# Patient Record
Sex: Male | Born: 1990
Health system: Southern US, Community
[De-identification: ages and names within clinical notes are randomized; demographics above are authoritative.]

## PROBLEM LIST (undated history)

## (undated) DIAGNOSIS — T7840XA Allergy, unspecified, initial encounter: Secondary | ICD-10-CM

## (undated) DIAGNOSIS — G43909 Migraine, unspecified, not intractable, without status migrainosus: Secondary | ICD-10-CM

## (undated) HISTORY — DX: Migraine, unspecified, not intractable, without status migrainosus: G43.909

## (undated) HISTORY — PX: NO PAST SURGERIES: SHX2092

## (undated) HISTORY — DX: Allergy, unspecified, initial encounter: T78.40XA

---
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
## 2013-06-19 ENCOUNTER — Encounter (HOSPITAL_COMMUNITY): Payer: Self-pay | Admitting: Emergency Medicine

## 2013-06-19 ENCOUNTER — Emergency Department (HOSPITAL_COMMUNITY): Payer: BC Managed Care – PPO

## 2013-06-19 ENCOUNTER — Emergency Department (HOSPITAL_COMMUNITY)
Admission: EM | Admit: 2013-06-19 | Discharge: 2013-06-19 | Disposition: A | Discharge status: Home / Self Care | Payer: BC Managed Care – PPO | Attending: Emergency Medicine | Admitting: Emergency Medicine

## 2013-06-19 DIAGNOSIS — R000 Tachycardia, unspecified: Secondary | ICD-10-CM | POA: Insufficient documentation

## 2013-06-19 DIAGNOSIS — R059 Cough, unspecified: Secondary | ICD-10-CM | POA: Insufficient documentation

## 2013-06-19 DIAGNOSIS — J111 Influenza due to unidentified influenza virus with other respiratory manifestations: Secondary | ICD-10-CM

## 2013-06-19 DIAGNOSIS — B9789 Other viral agents as the cause of diseases classified elsewhere: Secondary | ICD-10-CM | POA: Insufficient documentation

## 2013-06-19 DIAGNOSIS — R509 Fever, unspecified: Secondary | ICD-10-CM | POA: Insufficient documentation

## 2013-06-19 DIAGNOSIS — R05 Cough: Secondary | ICD-10-CM | POA: Insufficient documentation

## 2013-06-19 DIAGNOSIS — Z79899 Other long term (current) drug therapy: Secondary | ICD-10-CM | POA: Insufficient documentation

## 2013-06-19 LAB — URINALYSIS, ROUTINE W REFLEX MICROSCOPIC
Bilirubin Urine: NEGATIVE
Glucose, UA: NEGATIVE mg/dL
Ketones, ur: NEGATIVE mg/dL
LEUKOCYTES UA: NEGATIVE
NITRITE: NEGATIVE
Protein, ur: NEGATIVE mg/dL
SPECIFIC GRAVITY, URINE: 1.009 (ref 1.005–1.030)
UROBILINOGEN UA: 0.2 mg/dL (ref 0.0–1.0)
pH: 5.5 (ref 5.0–8.0)

## 2013-06-19 LAB — COMPREHENSIVE METABOLIC PANEL
ALT: 16 U/L (ref 0–53)
AST: 25 U/L (ref 0–37)
Albumin: 4.3 g/dL (ref 3.5–5.2)
Alkaline Phosphatase: 52 U/L (ref 39–117)
BUN: 7 mg/dL (ref 6–23)
CALCIUM: 8.7 mg/dL (ref 8.4–10.5)
CO2: 19 mEq/L (ref 19–32)
Chloride: 99 mEq/L (ref 96–112)
Creatinine, Ser: 1.03 mg/dL (ref 0.50–1.35)
GFR calc Af Amer: >90 mL/min (ref >90)
GFR calc non Af Amer: >90 mL/min (ref >90)
Glucose, Bld: 110 mg/dL — ABNORMAL HIGH (ref 70–99)
Potassium: 4.1 mEq/L (ref 3.7–5.3)
SODIUM: 135 meq/L — AB (ref 137–147)
TOTAL PROTEIN: 7.3 g/dL (ref 6.0–8.3)
Total Bilirubin: 0.4 mg/dL (ref 0.3–1.2)

## 2013-06-19 LAB — MONONUCLEOSIS SCREEN: MONO SCREEN: NEGATIVE

## 2013-06-19 LAB — CBC WITH DIFFERENTIAL/PLATELET
Basophils Absolute: 0 10*3/uL (ref 0.0–0.1)
Basophils Relative: 0 % (ref 0–1)
EOS ABS: 0 10*3/uL (ref 0.0–0.7)
Eosinophils Relative: 0 % (ref 0–5)
HEMATOCRIT: 42.7 % (ref 39.0–52.0)
HEMOGLOBIN: 15.6 g/dL (ref 13.0–17.0)
LYMPHS ABS: 0.9 10*3/uL (ref 0.7–4.0)
Lymphocytes Relative: 10 % — ABNORMAL LOW (ref 12–46)
MCH: 32 pg (ref 26.0–34.0)
MCHC: 36.5 g/dL — AB (ref 30.0–36.0)
MCV: 87.7 fL (ref 78.0–100.0)
Monocytes Absolute: 1.6 10*3/uL — ABNORMAL HIGH (ref 0.1–1.0)
Monocytes Relative: 18 % — ABNORMAL HIGH (ref 3–12)
Neutro Abs: 6.3 10*3/uL (ref 1.7–7.7)
Neutrophils Relative %: 72 % (ref 43–77)
PLATELETS: 127 10*3/uL — AB (ref 150–400)
RBC: 4.87 MIL/uL (ref 4.22–5.81)
RDW: 11.9 % (ref 11.5–15.5)
WBC: 8.7 10*3/uL (ref 4.0–10.5)

## 2013-06-19 LAB — LACTIC ACID, PLASMA: LACTIC ACID, VENOUS: 1.1 mmol/L (ref 0.5–2.2)

## 2013-06-19 LAB — URINE MICROSCOPIC-ADD ON

## 2013-06-19 LAB — RAPID STREP SCREEN (MED CTR MEBANE ONLY): Streptococcus, Group A Screen (Direct): NEGATIVE

## 2013-06-19 LAB — I-STAT CG4 LACTIC ACID, ED: Lactic Acid, Venous: 1.6 mmol/L (ref 0.5–2.2)

## 2013-06-19 MED ORDER — DEXAMETHASONE SODIUM PHOSPHATE 4 MG/ML IJ SOLN
4.0000 mg | Freq: Once | INTRAMUSCULAR | Status: AC
  Administered 2013-06-19: 4 mg via INTRAVENOUS
  Filled 2013-06-19: qty 1

## 2013-06-19 MED ORDER — ACETAMINOPHEN 500 MG PO TABS
1000.0000 mg | ORAL_TABLET | Freq: Once | ORAL | Status: AC
  Administered 2013-06-19: 1000 mg via ORAL
  Filled 2013-06-19: qty 2

## 2013-06-19 MED ORDER — SODIUM CHLORIDE 0.9 % IV BOLUS (SEPSIS)
1000.0000 mL | Freq: Once | INTRAVENOUS | Status: AC
  Administered 2013-06-19: 1000 mL via INTRAVENOUS

## 2013-06-19 MED ORDER — IOHEXOL 300 MG/ML  SOLN
100.0000 mL | Freq: Once | INTRAMUSCULAR | Status: AC | PRN
  Administered 2013-06-19: 100 mL via INTRAVENOUS

## 2013-06-19 MED ORDER — OSELTAMIVIR PHOSPHATE 75 MG PO CAPS: 75.0000 mg | ORAL_CAPSULE | Freq: Two times a day (BID) | ORAL | Status: DC

## 2013-06-19 NOTE — ED Provider Notes (Signed)
CSN: [id number]     Arrival date & time 06/19/13  1727 History   First MD Initiated Contact with Patient 06/19/13 1733     Chief Complaint  Patient presents with  . Fever  . Sore Throat     (Consider location/radiation/quality/duration/timing/severity/associated sxs/prior Treatment) The history is provided by the patient.  Jesse Foster is a 23 y.o. male here with sore throat. Fever for the last 3 days and sore throat since yesterday. He states that today he is beginning to have some trouble swallowing but is able to eat and drink today. Denies any shortness of breath or stridor. Has some nonproductive cough as well. Denies any nausea or vomiting. Denies any exposure to flu but didn't get a flu shot this year. He went to urgent care and had negative rapid strep.     History reviewed. No pertinent past medical history. History reviewed. No pertinent past surgical history. No family history on file. History  Substance Use Topics  . Smoking status: Never Smoker   . Smokeless tobacco: Not on file  . Alcohol Use: Yes    Review of Systems  Constitutional: Positive for fever.  HENT: Positive for sore throat.   All other systems reviewed and are negative.      Allergies  Review of patient's allergies indicates no known allergies.  Home Medications   Current Outpatient Rx  Name  Route  Sig  Dispense  Refill  . acetaminophen (TYLENOL) 500 MG tablet   Oral   Take 1,000 mg by mouth every 6 (six) hours as needed for headache.         . Chlorphen-Pseudoephed-APAP (THERAFLU FLU/COLD/SORE THROAT PO)   Oral   Take 1 packet by mouth every 4 (four) hours as needed (sore throat).         . clonazePAM (KLONOPIN) 1 MG tablet   Oral   Take 1 tablet by mouth. Twice daily for 4 days, then reduce by half every 4 days until weaned off.         . gabapentin (NEURONTIN) 100 MG capsule   Oral   Take 1 capsule by mouth 3 (three) times daily.         Marland Kitchen. ibuprofen (ADVIL,MOTRIN) 200 MG  tablet   Oral   Take 400 mg by mouth every 6 (six) hours as needed for moderate pain.         Marland Kitchen. lamoTRIgine (LAMICTAL) 100 MG tablet   Oral   Take 2 tablets by mouth daily.         . mirtazapine (REMERON) 15 MG tablet   Oral   Take 1 tablet by mouth at bedtime as needed. sleep         . PARoxetine (PAXIL) 20 MG tablet   Oral   Take 1 tablet by mouth daily.          BP 133/73  Pulse 135  Temp(Src) 104.1 F (40.1 C)  Resp 18  SpO2 98% Physical Exam  Nursing note and vitals reviewed. Constitutional: He is oriented to person, place, and time. He appears well-nourished.  Uncomfortable   HENT:  Head: Normocephalic.  OP slightly red. No obvious peritonsillar abscess.   Eyes: Conjunctivae are normal. Pupils are equal, round, and reactive to light.  Neck: Normal range of motion.  + bilateral cervical LAD, no stridor   Cardiovascular: Regular rhythm and normal heart sounds.   Tachycardic   Pulmonary/Chest: Effort normal and breath sounds normal. No respiratory distress. He has no  wheezes. He has no rales.  Abdominal: Soft. Bowel sounds are normal. He exhibits no distension. There is no tenderness. There is no rebound and no guarding.  Musculoskeletal: Normal range of motion. He exhibits no edema and no tenderness.  Neurological: He is alert and oriented to person, place, and time. No cranial nerve deficit. Coordination normal.  Skin: Skin is warm and dry.  Psychiatric: He has a normal mood and affect. His behavior is normal. Judgment and thought content normal.    ED Course  Procedures (including critical care time) Labs Review Labs Reviewed  CBC WITH DIFFERENTIAL - Abnormal; Notable for the following:    MCHC 36.5 (*)    Platelets 127 (*)    Lymphocytes Relative 10 (*)    Monocytes Relative 18 (*)    Monocytes Absolute 1.6 (*)    All other components within normal limits  COMPREHENSIVE METABOLIC PANEL - Abnormal; Notable for the following:    Sodium 135 (*)     Glucose, Bld 110 (*)    All other components within normal limits  URINALYSIS, ROUTINE W REFLEX MICROSCOPIC - Abnormal; Notable for the following:    Hgb urine dipstick SMALL (*)    All other components within normal limits  RAPID STREP SCREEN  CULTURE, GROUP A STREP  LACTIC ACID, PLASMA  MONONUCLEOSIS SCREEN  URINE MICROSCOPIC-ADD ON  I-STAT CG4 LACTIC ACID, ED   Imaging Review Ct Soft Tissue Neck W Contrast  06/19/2013   CLINICAL DATA:  Sore throat and fever.  EXAM: CT NECK WITH CONTRAST  TECHNIQUE: Multidetector CT imaging of the neck was performed using the standard protocol following the bolus administration of intravenous contrast.  CONTRAST:  OMNIPAQUE IOHEXOL 300 MG/ML  SOLN  COMPARISON:  None.  FINDINGS: There is slight prominence of the adenoids. Lingual tonsils are normal. There is no prevertebral soft tissue swelling, abscess, or other significant abnormality. The parotid glands and submandibular glands are normal. No significant adenopathy. No osseous abnormality. Base of the tongue and epiglottis appear normal.  IMPRESSION: Slight prominence of the adenoids.  Otherwise normal exam.   Electronically Signed   By: Geanie Cooley M.D.   On: 06/19/2013 19:30   Dg Chest Port 1 View  (if Code Sepsis Called)  06/19/2013   CLINICAL DATA:  Fever.  Confusion.  EXAM: PORTABLE CHEST - 1 VIEW  COMPARISON:  None.  FINDINGS: The heart size and mediastinal contours are within normal limits. Both lungs are clear. The visualized skeletal structures are unremarkable. Azygos fissure is noted in the right apex, a normal variant.  IMPRESSION: Normal exam.   Electronically Signed   By: Geanie Cooley M.D.   On: 06/19/2013 18:03     EKG Interpretation None      MDM   Final diagnoses:  None   Jesse Foster is a 23 y.o. male here with sore throat, fever, rapid strep neg. Consider mono vs flu vs RPA. No meningeal signs so I doubt meningitis. Will get CT neck, labs, cxr, UA. Will hydrate and  reassess.   8:37 PM WBC nl. Fever resolved and tachycardia improved. CT showed no RPA. Mono neg. I think he likely has flu. He is within window for tamiflu. I discussed risks and benefits of tamiflu and patient decides to get it.     Richardean Canal, MD 06/19/13 2040

## 2013-06-19 NOTE — Discharge Instructions (Signed)
Take tamiflu twice a day for 5 days.   Take tylenol 500 mg every 6 hrs and motrin 800 mg every 6 hrs for fever.   Follow up with your doctor.   Stay hydrated.   Return to ER if you have worse throat swelling, fever for a week, vomiting.

## 2013-06-19 NOTE — ED Notes (Signed)
Patient mother requesting update when dispo is made: Carollee LeitzLisa Eller [phone]

## 2013-06-19 NOTE — ED Notes (Addendum)
Sent form [facility] with Fever 105 sore throat, Step Negative Pt [facility] 800 mg Motrin [facility]

## 2013-06-19 NOTE — ED Notes (Signed)
Patient is alert and oriented x3.  He was given DC instructions and follow up visit instructions.  Patient gave verbal understanding.  He was DC ambulatory under his own power to home.  V/S stable.  He was not showing any signs of distress on DC 

## 2013-06-19 NOTE — ED Notes (Signed)
Bed: WA14 Expected date:  Expected time:  Means of arrival:  Comments: Triage pt 

## 2013-06-21 LAB — CULTURE, GROUP A STREP

## 2014-11-17 IMAGING — CT CT NECK W/ CM
2 of 3 series · 8 of 14 positions shown, 10 images · IV contrast (OMNIPAQUE 300)
Comparison: None.

CLINICAL DATA: Sore throat and fever.

EXAM:
CT NECK WITH CONTRAST
TECHNIQUE: Multidetector CT imaging of the neck was performed using the
standard protocol following the bolus administration of intravenous
contrast.
CONTRAST:  100mL OMNIPAQUE IOHEXOL 300 MG/ML  SOLN

[Series 2: neck with st · axial · 0.39mm/px · z∈[-178,-58]mm · 3 of 122 slices shown]
[im 31/122  bone]
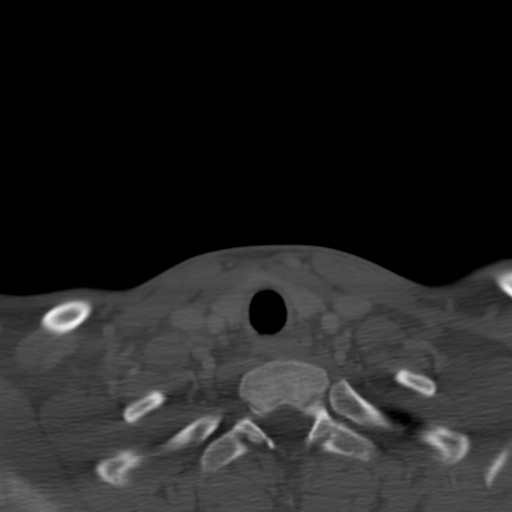
[im 61/122  bone]
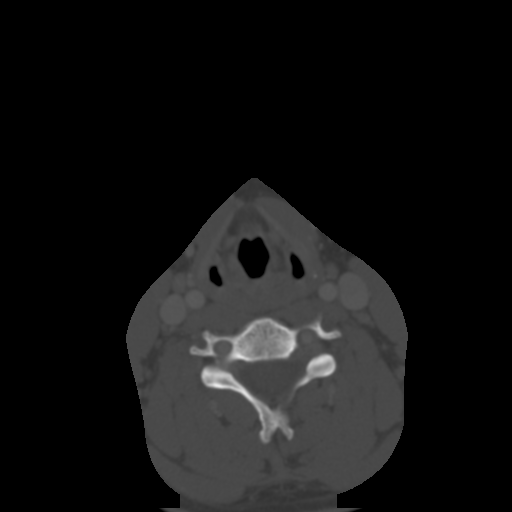
[im 91/122  bone]
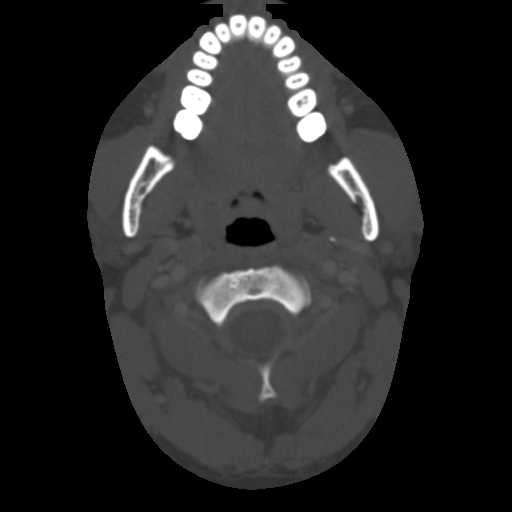

[Series 7: axial recons · axial · 0.39mm/px · z∈[-255,-55]mm · 5 of 161 slices shown, 7 images]
[im 27/161  soft-tissue]
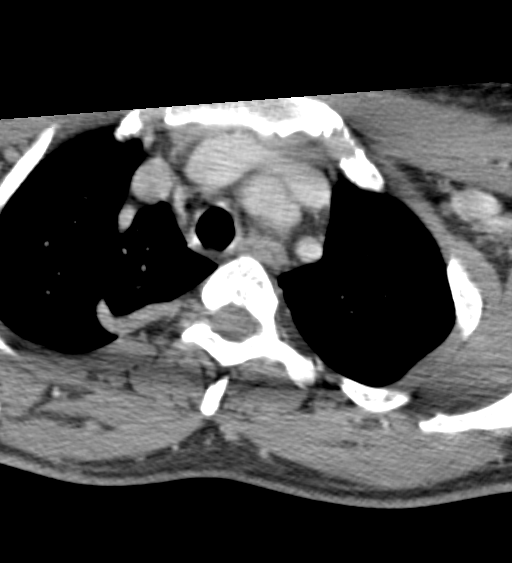
[im 27/161  bone]
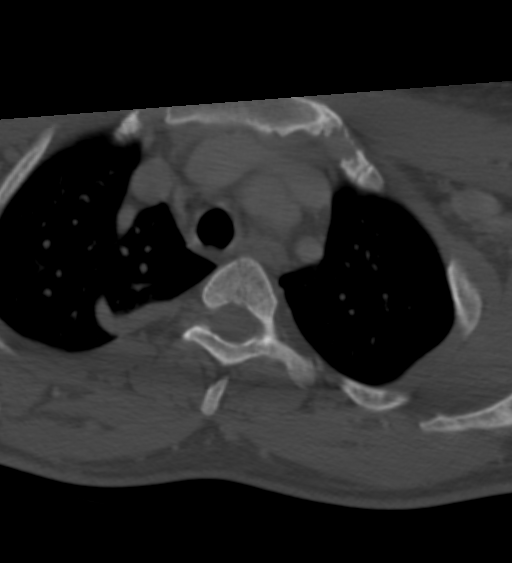
[im 54/161  bone]
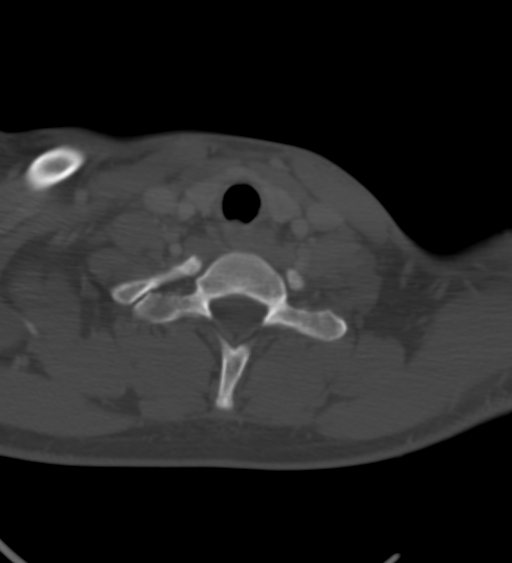
[im 81/161  bone]
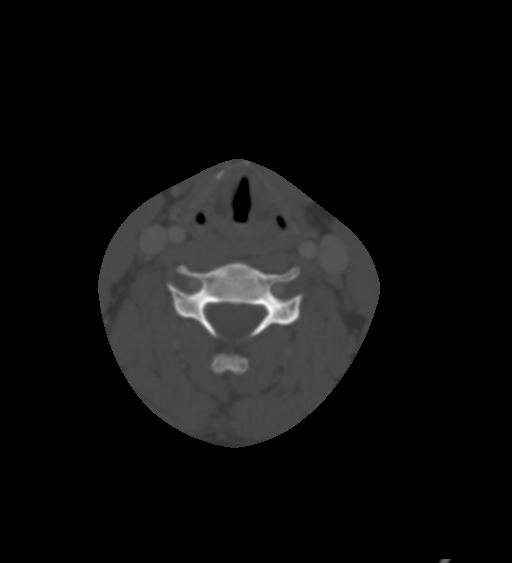
[im 107/161  bone]
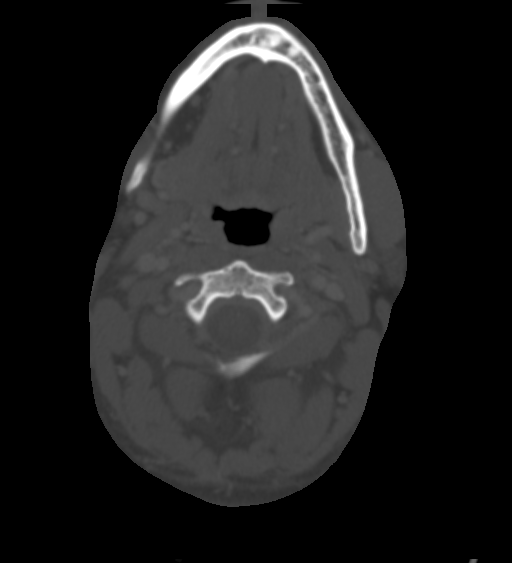
[im 134/161  soft-tissue]
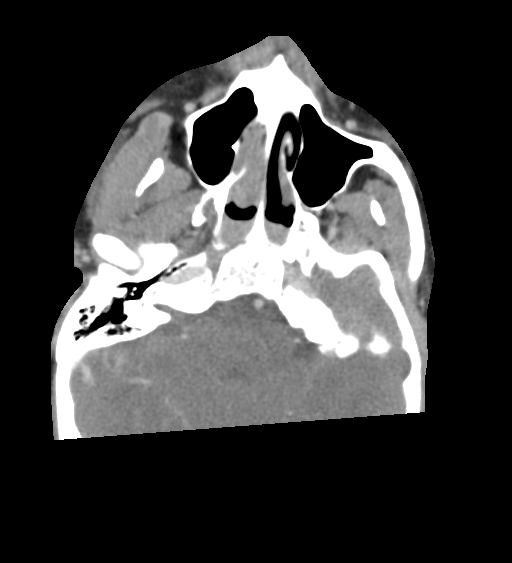
[im 134/161  bone]
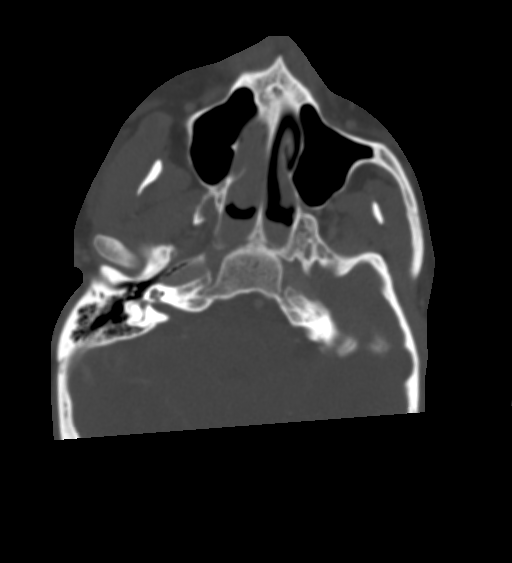

[8 of 14 positions shown; findings below may reference images not displayed]

FINDINGS: There is slight prominence of the adenoids. Lingual tonsils are
normal. There is no prevertebral soft tissue swelling, abscess, or
other significant abnormality. The parotid glands and submandibular
glands are normal. No significant adenopathy. No osseous
abnormality. Base of the tongue and epiglottis appear normal.
IMPRESSION: Slight prominence of the adenoids.  Otherwise normal exam.

## 2016-07-17 ENCOUNTER — Institutional Professional Consult (permissible substitution): Payer: BLUE CROSS/BLUE SHIELD | Admitting: Neurology

## 2016-09-08 ENCOUNTER — Institutional Professional Consult (permissible substitution): Payer: BLUE CROSS/BLUE SHIELD | Admitting: Neurology

## 2016-10-15 ENCOUNTER — Institutional Professional Consult (permissible substitution): Payer: BLUE CROSS/BLUE SHIELD | Admitting: Neurology

## 2016-10-15 ENCOUNTER — Telehealth: Payer: Self-pay

## 2016-10-15 NOTE — Telephone Encounter (Signed)
Pt did not show for their appt with Dr. Athar today.  

## 2016-10-16 ENCOUNTER — Encounter: Payer: Self-pay | Admitting: Neurology

## 2017-06-10 ENCOUNTER — Encounter: Payer: Self-pay | Admitting: Family Medicine

## 2017-06-10 ENCOUNTER — Ambulatory Visit: Payer: BLUE CROSS/BLUE SHIELD | Admitting: Family Medicine

## 2017-06-10 VITALS — BP 120/80 | HR 92 | Temp 98.7°F | Ht 73.0 in | Wt 215.9 lb

## 2017-06-10 DIAGNOSIS — J3089 Other allergic rhinitis: Secondary | ICD-10-CM | POA: Diagnosis not present

## 2017-06-10 DIAGNOSIS — R5383 Other fatigue: Secondary | ICD-10-CM | POA: Diagnosis not present

## 2017-06-10 DIAGNOSIS — Z8669 Personal history of other diseases of the nervous system and sense organs: Secondary | ICD-10-CM | POA: Insufficient documentation

## 2017-06-10 DIAGNOSIS — R42 Dizziness and giddiness: Secondary | ICD-10-CM

## 2017-06-10 NOTE — Progress Notes (Signed)
Subjective:     Patient ID: Jesse Foster, male   DOB: 03-08-1991, 27 y.o.   MRN: [id number]  HPI Patient is here to establish care. He states he has history of perennial allergies also had some borderline elevated blood pressure the past but never diagnosed with hypertension not treated. History of migraine headaches  His major complaint is he states that he had some severe allergies several months ago. He went to another practice and was prescribed oral prednisone for 9 days and went off the prednisone for 1 week and then went back on for 6 more days and then stopped again but after stopping prednisone had severe symptoms including flulike symptoms, dizziness, headaches, intermittent vertigo, weakness, cold intolerance. He states symptoms lasted about 6 weeks and are slowly improving but he still has some mild symptoms and still has some mild persistent vertigo. No clear triggers. Denies any speech changes, visual changes, or swallowing difficulty. No ataxia. No focal weakness. No daily headache.  Currently takes Nasacort and levocetirizine for his allergies.  Nonsmoker. No alcohol use. No illicit drug use. He is single. 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 Allergy   . Migraine    No past surgical history on file.  reports that  has never smoked. he has never used smokeless tobacco. He reports that he does not drink alcohol or use drugs. family history includes Cancer in his father; Heart disease in his father; Hyperlipidemia in his father. Allergies  Allergen Reactions  . Modafinil Other (See Comments)    Sore throat, agitation     Review of Systems  Constitutional: Positive for fatigue.  Respiratory: Negative for cough and shortness of breath.   Cardiovascular: Negative for chest pain.  Gastrointestinal: Negative for abdominal pain, nausea and vomiting.  Endocrine: Positive for cold intolerance.  Neurological: Positive for  dizziness, weakness and headaches. Negative for tremors, seizures and syncope.       Objective:   Physical Exam  Constitutional: He is oriented to person, place, and time. He appears well-developed and well-nourished.  HENT:  Right Ear: External ear normal.  Left Ear: External ear normal.  Mouth/Throat: Oropharynx is clear and moist.  Eyes: Pupils are equal, round, and reactive to light.  Neck: Neck supple. No thyromegaly present.  Cardiovascular: Normal rate and regular rhythm.  Pulmonary/Chest: Effort normal and breath sounds normal. No respiratory distress. He has no wheezes. He has no rales.  Musculoskeletal: He exhibits no edema.  Lymphadenopathy:    He has no cervical adenopathy.  Neurological: He is alert and oriented to person, place, and time. No cranial nerve deficit.  Skin: No rash noted.       Assessment:     Patient resents with multiple nonspecific symptoms as above including dizziness, weakness, vertigo, cold intolerance. He is concerned specifically about adrenal suppression issues (from abrupt discontinuation of prednisone several weeks ago) but we explained this would be very rare after just 9 days of prednisone. Nevertheless, symptoms are improving somewhat.    Plan:     -Patient will return for fasting labs including CBC, comprehensive metabolic panel, TSH, cortisol level -he had questions about use of Nasacort and we explained little systemic absorption with that.   Jesse CoveyBruce W Masud Holub MD [facility]

## 2017-06-15 ENCOUNTER — Other Ambulatory Visit (INDEPENDENT_AMBULATORY_CARE_PROVIDER_SITE_OTHER): Payer: BLUE CROSS/BLUE SHIELD

## 2017-06-15 DIAGNOSIS — R5383 Other fatigue: Secondary | ICD-10-CM | POA: Diagnosis not present

## 2017-06-15 DIAGNOSIS — R42 Dizziness and giddiness: Secondary | ICD-10-CM | POA: Diagnosis not present

## 2017-06-15 LAB — COMPREHENSIVE METABOLIC PANEL WITH GFR
ALT: 20 U/L (ref 0–53)
AST: 13 U/L (ref 0–37)
Albumin: 4.8 g/dL (ref 3.5–5.2)
Alkaline Phosphatase: 60 U/L (ref 39–117)
BUN: 20 mg/dL (ref 6–23)
CO2: 27 meq/L (ref 19–32)
Calcium: 9.8 mg/dL (ref 8.4–10.5)
Chloride: 102 meq/L (ref 96–112)
Creatinine, Ser: 0.86 mg/dL (ref 0.40–1.50)
GFR: 112.45 mL/min
Glucose, Bld: 88 mg/dL (ref 70–99)
Potassium: 4.3 meq/L (ref 3.5–5.1)
Sodium: 139 meq/L (ref 135–145)
Total Bilirubin: 0.4 mg/dL (ref 0.2–1.2)
Total Protein: 7.3 g/dL (ref 6.0–8.3)

## 2017-06-15 LAB — CBC WITH DIFFERENTIAL/PLATELET
BASOS PCT: 0.6 % (ref 0.0–3.0)
Basophils Absolute: 0 10*3/uL (ref 0.0–0.1)
EOS PCT: 1.1 % (ref 0.0–5.0)
Eosinophils Absolute: 0.1 10*3/uL (ref 0.0–0.7)
HEMATOCRIT: 47.5 % (ref 39.0–52.0)
HEMOGLOBIN: 16.6 g/dL (ref 13.0–17.0)
LYMPHS PCT: 29.3 % (ref 12.0–46.0)
Lymphs Abs: 2.3 10*3/uL (ref 0.7–4.0)
MCHC: 35 g/dL (ref 30.0–36.0)
MCV: 88.5 fl (ref 78.0–100.0)
MONOS PCT: 6 % (ref 3.0–12.0)
Monocytes Absolute: 0.5 10*3/uL (ref 0.1–1.0)
Neutro Abs: 4.9 10*3/uL (ref 1.4–7.7)
Neutrophils Relative %: 63 % (ref 43.0–77.0)
Platelets: 257 10*3/uL (ref 150.0–400.0)
RBC: 5.36 Mil/uL (ref 4.22–5.81)
RDW: 12.7 % (ref 11.5–15.5)
WBC: 7.8 10*3/uL (ref 4.0–10.5)

## 2017-06-15 LAB — CORTISOL: CORTISOL PLASMA: 12.4 ug/dL

## 2017-06-15 LAB — TSH: TSH: 1.69 u[IU]/mL (ref 0.35–4.50)

## 2017-10-13 ENCOUNTER — Encounter

## 2017-10-13 ENCOUNTER — Telehealth: Payer: Self-pay | Admitting: Neurology

## 2017-10-13 ENCOUNTER — Encounter: Payer: Self-pay | Admitting: Neurology

## 2017-10-13 ENCOUNTER — Ambulatory Visit: Payer: BLUE CROSS/BLUE SHIELD | Admitting: Neurology

## 2017-10-13 VITALS — BP 144/92 | HR 75 | Ht 73.0 in | Wt 216.0 lb

## 2017-10-13 DIAGNOSIS — H539 Unspecified visual disturbance: Secondary | ICD-10-CM

## 2017-10-13 DIAGNOSIS — R51 Headache with orthostatic component, not elsewhere classified: Secondary | ICD-10-CM

## 2017-10-13 DIAGNOSIS — G8929 Other chronic pain: Secondary | ICD-10-CM

## 2017-10-13 DIAGNOSIS — G9340 Encephalopathy, unspecified: Secondary | ICD-10-CM

## 2017-10-13 DIAGNOSIS — G43009 Migraine without aura, not intractable, without status migrainosus: Secondary | ICD-10-CM | POA: Insufficient documentation

## 2017-10-13 DIAGNOSIS — H538 Other visual disturbances: Secondary | ICD-10-CM | POA: Diagnosis not present

## 2017-10-13 DIAGNOSIS — R519 Headache, unspecified: Secondary | ICD-10-CM | POA: Insufficient documentation

## 2017-10-13 DIAGNOSIS — W57XXXA Bitten or stung by nonvenomous insect and other nonvenomous arthropods, initial encounter: Secondary | ICD-10-CM

## 2017-10-13 NOTE — Telephone Encounter (Signed)
MR Brain wo contrast  Dr. Valaria GoodAhern [employer] Auth: [id number] (exp. 10/13/17 to 11/11/17). [facility].

## 2017-10-13 NOTE — Patient Instructions (Signed)
MRI of the brain   Headache A headache is pain or discomfort felt around the head or neck area. There are many causes and types of headaches. In some cases, the cause may not be found. Follow these instructions at home: Managing pain  Take over-the-counter and prescription medicines only as told by your doctor.  Lie down in a dark, quiet room when you have a headache.  If directed, apply ice to the head and neck area: ? Put ice in a plastic bag. ? Place a towel between your skin and the bag. ? Leave the ice on for 20 minutes, 2-3 times per day.  Use a heating pad or hot shower to apply heat to the head and neck area as told by your doctor.  Keep lights dim if bright lights bother you or make your headaches worse. Eating and drinking  Eat meals on a regular schedule.  Lessen how much alcohol you drink.  Lessen how much caffeine you drink, or stop drinking caffeine. General instructions  Keep all follow-up visits as told by your doctor. This is important.  Keep a journal to find out if certain things bring on headaches. For example, write down: ? What you eat and drink. ? How much sleep you get. ? Any change to your diet or medicines.  Relax by getting a massage or doing other relaxing activities.  Lessen stress.  Sit up straight. Do not tighten (tense) your muscles.  Do not use tobacco products. This includes cigarettes, chewing tobacco, or e-cigarettes. If you need help quitting, ask your doctor.  Exercise regularly as told by your doctor.  Get enough sleep. This often means 7-9 hours of sleep. Contact a doctor if:  Your symptoms are not helped by medicine.  You have a headache that feels different than the other headaches.  You feel sick to your stomach (nauseous) or you throw up (vomit).  You have a fever. Get help right away if:  Your headache becomes really bad.  You keep throwing up.  You have a stiff neck.  You have trouble seeing.  You have  trouble speaking.  You have pain in the eye or ear.  Your muscles are weak or you lose muscle control.  You lose your balance or have trouble walking.  You feel like you will pass out (faint) or you pass out.  You have confusion. This information is not intended to replace advice given to you by your health care provider. Make sure you discuss any questions you have with your health care provider. Document Released: 01/01/2008 Document Revised: 08/30/2015 Document Reviewed: 07/17/2014 Elsevier Interactive Patient Education  2018 [employer]   Migraine Headache A migraine headache is a very strong throbbing pain on one side or both sides of your head. Migraines can also cause other symptoms. Talk with your doctor about what things may bring on (trigger) your migraine headaches. Follow these instructions at home: Medicines  Take over-the-counter and prescription medicines only as told by your doctor.  Do not drive or use heavy machinery while taking prescription pain medicine.  To prevent or treat constipation while you are taking prescription pain medicine, your doctor may recommend that you: ? Drink enough fluid to keep your pee (urine) clear or pale yellow. ? Take over-the-counter or prescription medicines. ? Eat foods that are high in fiber. These include fresh fruits and vegetables, whole grains, and beans. ? Limit foods that are high in fat and processed sugars. These include fried and sweet  foods. Lifestyle  Avoid alcohol.  Do not use any products that contain nicotine or tobacco, such as cigarettes and e-cigarettes. If you need help quitting, ask your doctor.  Get at least 8 hours of sleep every night.  Limit your stress. General instructions   Keep a journal to find out what may bring on your migraines. For example, write down: ? What you eat and drink. ? How much sleep you get. ? Any change in what you eat or drink. ? Any change in your medicines.  If you  have a migraine: ? Avoid things that make your symptoms worse, such as bright lights. ? It may help to lie down in a dark, quiet room. ? Do not drive or use heavy machinery. ? Ask your doctor what activities are safe for you.  Keep all follow-up visits as told by your doctor. This is important. Contact a doctor if:  You get a migraine that is different or worse than your usual migraines. Get help right away if:  Your migraine gets very bad.  You have a fever.  You have a stiff neck.  You have trouble seeing.  Your muscles feel weak or like you cannot control them.  You start to lose your balance a lot.  You start to have trouble walking.  You pass out (faint). This information is not intended to replace advice given to you by your health care provider. Make sure you discuss any questions you have with your health care provider. Document Released: 01/01/2008 Document Revised: 10/12/2015 Document Reviewed: 09/10/2015 Elsevier Interactive Patient Education  2018 [employer].
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
## 2017-10-14 LAB — B. BURGDORFI ANTIBODIES: Lyme IgG/IgM Ab: <0.91 {ISR} (ref 0.00–0.90)

## 2017-10-15 ENCOUNTER — Telehealth: Payer: Self-pay | Admitting: *Deleted

## 2017-10-15 NOTE — Telephone Encounter (Addendum)
Spoke with pt and informed him that his lyme lab is negative. He verbalized understanding and his questions were answered. Informed pt that after his MRI on July 16 ,we will call him with those results. He verbalized appreciation.   ----- Message from Anson FretAntonia B Ahern, MD sent at 10/14/2017  5:32 PM EDT ----- Lyme negative thanks

## 2017-10-20 ENCOUNTER — Ambulatory Visit: Payer: BLUE CROSS/BLUE SHIELD

## 2017-10-20 DIAGNOSIS — H539 Unspecified visual disturbance: Secondary | ICD-10-CM | POA: Diagnosis not present

## 2017-10-20 DIAGNOSIS — G9340 Encephalopathy, unspecified: Secondary | ICD-10-CM

## 2017-10-20 DIAGNOSIS — R51 Headache with orthostatic component, not elsewhere classified: Secondary | ICD-10-CM

## 2017-10-20 DIAGNOSIS — R519 Headache, unspecified: Secondary | ICD-10-CM

## 2017-10-20 DIAGNOSIS — H538 Other visual disturbances: Secondary | ICD-10-CM | POA: Diagnosis not present

## 2017-10-22 ENCOUNTER — Telehealth: Payer: Self-pay | Admitting: *Deleted

## 2017-10-22 NOTE — Telephone Encounter (Signed)
Spoke with patient and informed him his MRI brain result is normal. He asked how to get a copy of the images. This RN advised he come to the office, sign a release and let office know which records he would like a copy. He had no further questions, verbalized understanding, appreciation.

## 2017-10-23 ENCOUNTER — Encounter: Payer: Self-pay | Admitting: Neurology
